# Patient Record
Sex: Male | Born: 1940 | Race: White | Hispanic: No | Marital: Married | Smoking: Never smoker
Health system: Southern US, Community
[De-identification: ages and names within clinical notes are randomized; demographics above are authoritative.]

## PROBLEM LIST (undated history)

## (undated) DIAGNOSIS — K219 Gastro-esophageal reflux disease without esophagitis: Secondary | ICD-10-CM

## (undated) DIAGNOSIS — D649 Anemia, unspecified: Secondary | ICD-10-CM

## (undated) DIAGNOSIS — M1990 Unspecified osteoarthritis, unspecified site: Secondary | ICD-10-CM

## (undated) DIAGNOSIS — K635 Polyp of colon: Secondary | ICD-10-CM

## (undated) DIAGNOSIS — J449 Chronic obstructive pulmonary disease, unspecified: Secondary | ICD-10-CM

## (undated) DIAGNOSIS — K449 Diaphragmatic hernia without obstruction or gangrene: Secondary | ICD-10-CM

## (undated) DIAGNOSIS — I10 Essential (primary) hypertension: Secondary | ICD-10-CM

## (undated) HISTORY — DX: Chronic obstructive pulmonary disease, unspecified: J44.9

## (undated) HISTORY — DX: Unspecified osteoarthritis, unspecified site: M19.90

## (undated) HISTORY — DX: Polyp of colon: K63.5

## (undated) HISTORY — DX: Essential (primary) hypertension: I10

## (undated) HISTORY — DX: Anemia, unspecified: D64.9

## (undated) HISTORY — DX: Gastro-esophageal reflux disease without esophagitis: K21.9

## (undated) HISTORY — DX: Diaphragmatic hernia without obstruction or gangrene: K44.9

---

## 2002-06-16 HISTORY — PX: REPLACEMENT TOTAL KNEE: SUR1224

## 2011-06-17 HISTORY — PX: LUMBAR LAMINECTOMY: SHX95

## 2015-06-17 HISTORY — PX: TOTAL KNEE ARTHROPLASTY: SHX125

## 2019-10-17 ENCOUNTER — Ambulatory Visit: Payer: Medicare Other | Attending: Family Medicine | Admitting: Physical Therapy

## 2019-10-17 ENCOUNTER — Encounter: Payer: Self-pay | Admitting: Physical Therapy

## 2019-10-17 ENCOUNTER — Other Ambulatory Visit: Payer: Self-pay

## 2019-10-17 DIAGNOSIS — R2689 Other abnormalities of gait and mobility: Secondary | ICD-10-CM | POA: Insufficient documentation

## 2019-10-17 DIAGNOSIS — I350 Nonrheumatic aortic (valve) stenosis: Secondary | ICD-10-CM | POA: Insufficient documentation

## 2019-10-17 DIAGNOSIS — J302 Other seasonal allergic rhinitis: Secondary | ICD-10-CM | POA: Insufficient documentation

## 2019-10-17 DIAGNOSIS — D649 Anemia, unspecified: Secondary | ICD-10-CM | POA: Insufficient documentation

## 2019-10-17 DIAGNOSIS — R296 Repeated falls: Secondary | ICD-10-CM | POA: Diagnosis present

## 2019-10-17 DIAGNOSIS — R600 Localized edema: Secondary | ICD-10-CM | POA: Insufficient documentation

## 2019-10-17 DIAGNOSIS — Z96653 Presence of artificial knee joint, bilateral: Secondary | ICD-10-CM | POA: Insufficient documentation

## 2019-10-17 DIAGNOSIS — N189 Chronic kidney disease, unspecified: Secondary | ICD-10-CM | POA: Insufficient documentation

## 2019-10-17 DIAGNOSIS — M6281 Muscle weakness (generalized): Secondary | ICD-10-CM | POA: Insufficient documentation

## 2019-10-17 NOTE — Therapy (Signed)
[facility] [address] [facility] [address] [facility] [address] [place], [place], [address] Phone: [phone]   Fax:  [phone]  Physical Therapy Evaluation  Patient Details  Name: Sean Hood MRN: [id number] Date of Birth: 1941/02/09 Referring Provider (PT): Tracey Harries   Encounter Date: 10/17/2019  PT End of Session - 10/17/19 1508    Visit Number  1    Date for PT Re-Evaluation  12/17/19    Authorization Type  Medicare    PT Start Time  1400    PT Stop Time  1442    PT Time Calculation (min)  42 min    Activity Tolerance  Patient tolerated treatment well    Behavior During Therapy  [facility] for tasks assessed/performed       Past Medical History:  Diagnosis Date  . Anemia   . Arthritis   . Colon polyp   . COPD (chronic obstructive pulmonary disease) (HCC)   . GERD (gastroesophageal reflux disease)   . Hiatal hernia   . Hypertension     Past Surgical History:  Procedure Laterality Date  . LUMBAR LAMINECTOMY  2013  . REPLACEMENT TOTAL KNEE Right 2004  . TOTAL KNEE ARTHROPLASTY Left 2017    There were no vitals filed for this visit.   Subjective Assessment - 10/17/19 1403    Subjective  Pt reports that he has been falling a lot increasing over the past year. Pt states he has fallen 15-20 times in the past month; reports no injuries with falls. Pt has had bilateral TKA. Pt states that sometimes knees "don't bend" the way they should. Pt reports that he has dizziness that sometimes causes him to feel off balance.    Pertinent History  bilateral TKA, HTN    Limitations  Lifting;Standing;Walking;House hold activities    Patient Stated Goals  fall less    Currently in Pain?  No/denies    Multiple Pain Sites  No         [facility] PT Assessment - 10/17/19 0001      Assessment   Medical Diagnosis  Frequent Falls    Referring Provider (PT)  Tracey Harries    Prior Therapy  PT for TKA      Precautions   Precautions  Fall      Restrictions   Weight Bearing Restrictions  No      Balance Screen   Has the patient fallen in the past 6 months  Yes    How many times?  30    Has the patient had a decrease in activity level because of a fear of falling?   No    Is the patient reluctant to leave their home because of a fear of falling?   No      Home Environment   Living Environment  Private residence    Living Arrangements  Spouse/significant other    Type of Home  House    Home Access  Stairs to enter    Entrance Stairs-Number of Steps  3    Entrance Stairs-Rails  Right    Additional Comments  Reports difficulty going up stairs into house      Prior Function   Level of Independence  Independent    Vocation  Retired    Leisure  yardwork, woodworking in shop (unable to do right now)      Presenter, broadcasting [employer]      Functional Tests   Functional tests  Other      Other:   Other/ Comments  occulomotor exam WFL (did not provoke sx of dizziness)      Posture/Postural Control   Posture/Postural Control  Postural limitations    Postural Limitations  Rounded Shoulders;Forward head;Increased thoracic kyphosis;Flexed trunk      ROM / Strength   AROM / PROM / Strength  Strength      Strength   Overall Strength  Deficits    Overall Strength Comments  LE knee flex/ext ankle DF 5/5, hip flex/abd 3+/5, hip ext 3-/5      Flexibility   Soft Tissue Assessment /Muscle Length  yes    Hamstrings  limited and painful B    Quadriceps  limited B    Piriformis  limited B      Palpation   Palpation comment  tender to palpation B ITB      Transfers   Five time sit to stand comments   unable to complete 5 reps successfully    Comments  needed table lifted to complete 5 sit to stand; unable to complete one sit to stand without use of UE      Ambulation/Gait   Gait Comments  walks with widened BOS      Balance   Balance Assessed  Yes      Static Standing Balance   Static Standing - Comment/# of Minutes  Rhomberg:  WFL, Rhomberg eyes closed: unable <3 sec                Objective measurements completed on examination: See above findings.      [facility] Adult PT Treatment/Exercise - 10/17/19 0001      Exercises   Exercises  Lumbar;Knee/Hip      Lumbar Exercises: Stretches   Active Hamstring Stretch  Right;Left;1 rep;30 seconds    Active Hamstring Stretch Limitations  seated      Lumbar Exercises: Seated   Sit to Stand  5 reps    Sit to Stand Limitations  needed UE support; table had to be raised after 3 reps. Fatigues very quickly with sit to stand      Lumbar Exercises: Supine   Bridge  10 reps;3 seconds      Lumbar Exercises: Sidelying   Hip Abduction  Both;10 reps    Hip Abduction Weights (lbs)  body weight             PT Education - 10/17/19 1507    Education Details  Pt educated on condition, HEP, and rehab process    Person(s) Educated  Patient    Methods  Explanation;Demonstration;Handout    Comprehension  Returned demonstration;Verbalized understanding       PT Short Term Goals - 10/17/19 1515      PT SHORT TERM GOAL #1   Title  Pt will be independent with HEP    Time  2    Period  Weeks    Status  New    Target Date  10/31/19        PT Long Term Goals - 10/17/19 1515      PT LONG TERM GOAL #1   Title  Pt will demonstrate ability to stand from chair with use of UE x5 in <15 seconds    Baseline  unable to complete    Time  8    Period  Weeks    Status  New    Target Date  12/12/19      PT LONG TERM GOAL #2  Title  Pt will improve B hip flex/abd/ext strength to 4/5    Baseline  3/5    Time  8    Period  Weeks    Target Date  12/12/19      PT LONG TERM GOAL #3   Title  Pt will report no falls at home to reduce risk of injury    Time  8    Period  Weeks    Status  New    Target Date  12/12/19      PT LONG TERM GOAL #4   Title  Pt will demonstrate ability to ascend/descend 1 set of stairs with 1 HR    Time  8    Period  Weeks    Status   New    Target Date  12/12/19             Plan - 10/17/19 1510    Clinical Impression Statement  Pt presents to clinic with reports of frequent falls and no injuries to date. Pt reports that he has trouble getting knees to work and frequently trips over feet. Pt demonstrates decreased LE strength especially in hip ext/abd/flex, LE flexibility deficits, functional strength deficits, and balance deficits. Pt would benefit from skilled PT to address the above impairments.    Personal Factors and Comorbidities  Age;Comorbidity 1    Comorbidities  HTN    Examination-Activity Limitations  Lift;Stand;Stairs;Squat;Transfers    Examination-Participation Restrictions  Community Activity;Yard Work    Stability/Clinical Decision Making  Stable/Uncomplicated    Designer, jewellery  Low    Rehab Potential  Good    PT Frequency  2x / week    PT Duration  8 weeks    PT Treatment/Interventions  ADLs/Self Care Home Management;Electrical Stimulation;Cryotherapy;Gait training;Stair training;Functional mobility training;Therapeutic activities;Therapeutic exercise;Balance training;Neuromuscular re-education;Manual techniques;Patient/family education;Passive range of motion    PT Next Visit Plan  Further assess balance, initiate LE strength/flexibility training, balance ex's, functional activities    PT Home Exercise Plan  supine bridge, seated hamstring stretch, sidelying hip abduction, sit to stand with UE support    Consulted and Agree with Plan of Care  Patient       Patient will benefit from skilled therapeutic intervention in order to improve the following deficits and impairments:  Abnormal gait, Decreased range of motion, Difficulty walking, Decreased endurance, Decreased safety awareness, Decreased activity tolerance, Decreased balance, Hypomobility, Impaired flexibility, Improper body mechanics, Decreased mobility, Decreased strength, Increased edema  Visit Diagnosis: Muscle weakness  (generalized)  Frequent falls  Balance problem     Problem List Patient Active Problem List   Diagnosis Date Noted  . Frequent falls 10/17/2019  . Seasonal allergies 10/17/2019  . Nonrheumatic aortic (valve) stenosis 10/17/2019  . Chronic kidney disease (CKD) 10/17/2019  . Anemia 10/17/2019  . Lower extremity edema 10/17/2019  . Total knee replacement status, bilateral 10/17/2019   Amador Cunas, PT, DPT Donald Prose Scarlet Abad 10/17/2019, 3:19 PM  [place], [place], [address] Phone: [phone]   Fax:  [phone]  Name: Mack Alvidrez MRN: [id number] Date of Birth: July 27, 1940

## 2019-10-26 ENCOUNTER — Ambulatory Visit: Payer: Medicare Other | Admitting: Physical Therapy

## 2019-10-26 ENCOUNTER — Encounter: Payer: Self-pay | Admitting: Physical Therapy

## 2019-10-26 ENCOUNTER — Other Ambulatory Visit: Payer: Self-pay

## 2019-10-26 DIAGNOSIS — R296 Repeated falls: Secondary | ICD-10-CM

## 2019-10-26 DIAGNOSIS — M6281 Muscle weakness (generalized): Secondary | ICD-10-CM

## 2019-10-26 DIAGNOSIS — R2689 Other abnormalities of gait and mobility: Secondary | ICD-10-CM

## 2019-10-26 NOTE — Therapy (Signed)
[facility] [address] [place], [place], [address] Phone: [phone]   Fax:  [phone]  Physical Therapy Treatment  Patient Details  Name: Sean Hood MRN: [id number] Date of Birth: 01/14/1941 Referring Provider (PT): Tracey Harries   Encounter Date: 10/26/2019  PT End of Session - 10/26/19 1507    Visit Number  2    Date for PT Re-Evaluation  12/17/19    Authorization Type  Medicare    PT Start Time  1430    PT Stop Time  1510    PT Time Calculation (min)  40 min    Activity Tolerance  Patient tolerated treatment well    Behavior During Therapy  [facility] for tasks assessed/performed       Past Medical History:  Diagnosis Date  . Anemia   . Arthritis   . Colon polyp   . COPD (chronic obstructive pulmonary disease) (HCC)   . GERD (gastroesophageal reflux disease)   . Hiatal hernia   . Hypertension     Past Surgical History:  Procedure Laterality Date  . LUMBAR LAMINECTOMY  2013  . REPLACEMENT TOTAL KNEE Right 2004  . TOTAL KNEE ARTHROPLASTY Left 2017    There were no vitals filed for this visit.  Subjective Assessment - 10/26/19 1431    Subjective  "I am doing good"    Pertinent History  bilateral TKA, HTN    Currently in Pain?  No/denies                        [facility] Adult PT Treatment/Exercise - 10/26/19 0001      High Level Balance   High Level Balance Activities  Side stepping;Backward walking    High Level Balance Comments  sidestep over foam roll 2x5       Lumbar Exercises: Aerobic   Nustep  L4 x 5 min       Knee/Hip Exercises: Machines for Strengthening   Cybex Knee Extension  10lb 2x15    Cybex Knee Flexion  25lb 2x15      Knee/Hip Exercises: Standing   Forward Step Up  Both;1 set;10 reps;Hand Hold: 1;Step Height: 6"    Other Standing Knee Exercises  Alt 4in box taps 2x10 CGA       Knee/Hip Exercises: Seated   Sit to Sand  2 sets;5 reps;without UE support   from  UBE seat               PT Short Term Goals - 10/17/19 1515      PT SHORT TERM GOAL #1   Title  Pt will be independent with HEP    Time  2    Period  Weeks    Status  New    Target Date  10/31/19        PT Long Term Goals - 10/17/19 1515      PT LONG TERM GOAL #1   Title  Pt will demonstrate ability to stand from chair with use of UE x5 in <15 seconds    Baseline  unable to complete    Time  8    Period  Weeks    Status  New    Target Date  12/12/19      PT LONG TERM GOAL #2   Title  Pt will improve B hip flex/abd/ext strength to 4/5    Baseline  3/5    Time  8  Period  Weeks    Target Date  12/12/19      PT LONG TERM GOAL #3   Title  Pt will report no falls at home to reduce risk of injury    Time  8    Period  Weeks    Status  New    Target Date  12/12/19      PT LONG TERM GOAL #4   Title  Pt will demonstrate ability to ascend/descend 1 set of stairs with 1 HR    Time  8    Period  Weeks    Status  New    Target Date  12/12/19            Plan - 10/26/19 1508    Clinical Impression Statement  Pt tolerated an initial progression to TE fair. He reports some issues with his L knee. He stated that's it is always sore (TKA 2018). Today's he was abe to complete sit to stands from elevated UBE seat. Increase fatigue noted throughout requiring multiple rest breaks. CGA needed for most balance activities. Unable to complete all 10 reps with LLE doing step ups.    Personal Factors and Comorbidities  Age;Comorbidity 1    Comorbidities  HTN    Examination-Activity Limitations  Lift;Stand;Stairs;Squat;Transfers    Examination-Participation Restrictions  Community Activity;Yard Work;School    Stability/Clinical Decision Making  Stable/Uncomplicated    Rehab Potential  Good    PT Frequency  2x / week    PT Duration  8 weeks    PT Treatment/Interventions  ADLs/Self Care Home Management;Electrical Stimulation;Cryotherapy;Gait training;Stair training;Functional  mobility training;Therapeutic activities;Therapeutic exercise;Balance training;Neuromuscular re-education;Manual techniques;Patient/family education;Passive range of motion    PT Next Visit Plan  Further assess balance, initiate LE strength/flexibility training, balance ex's, functional activities       Patient will benefit from skilled therapeutic intervention in order to improve the following deficits and impairments:  Abnormal gait, Decreased range of motion, Difficulty walking, Decreased endurance, Decreased safety awareness, Decreased activity tolerance, Decreased balance, Hypomobility, Impaired flexibility, Improper body mechanics, Decreased mobility, Decreased strength, Increased edema  Visit Diagnosis: Muscle weakness (generalized)  Frequent falls  Balance problem     Problem List Patient Active Problem List   Diagnosis Date Noted  . Frequent falls 10/17/2019  . Seasonal allergies 10/17/2019  . Nonrheumatic aortic (valve) stenosis 10/17/2019  . Chronic kidney disease (CKD) 10/17/2019  . Anemia 10/17/2019  . Lower extremity edema 10/17/2019  . Total knee replacement status, bilateral 10/17/2019    Scot Jun, PTA 10/26/2019, 3:12 PM  [place], [place], [address] Phone: [phone]   Fax:  [phone]  Name: Sean Hood MRN: [id number] Date of Birth: May 10, 1941

## 2019-11-01 ENCOUNTER — Other Ambulatory Visit: Payer: Self-pay

## 2019-11-01 ENCOUNTER — Encounter: Payer: Self-pay | Admitting: Physical Therapy

## 2019-11-01 ENCOUNTER — Ambulatory Visit: Payer: Medicare Other | Admitting: Physical Therapy

## 2019-11-01 DIAGNOSIS — R296 Repeated falls: Secondary | ICD-10-CM

## 2019-11-01 DIAGNOSIS — M6281 Muscle weakness (generalized): Secondary | ICD-10-CM

## 2019-11-01 DIAGNOSIS — R2689 Other abnormalities of gait and mobility: Secondary | ICD-10-CM

## 2019-11-01 NOTE — Therapy (Signed)
[place], [place], [address] Phone: [phone]   Fax:  [phone]  Physical Therapy Treatment  Patient Details  Name: Sean Hood MRN: [id number] Date of Birth: [birth year] Referring Provider (PT): Bernerd Limbo   Encounter Date: 11/01/2019  PT End of Session - 11/01/19 1445    Visit Number  3    Date for PT Re-Evaluation  12/17/19    PT Start Time  1400    PT Stop Time  1445    PT Time Calculation (min)  45 min    Activity Tolerance  Patient tolerated treatment well    Behavior During Therapy  [facility] for tasks assessed/performed       Past Medical History:  Diagnosis Date  . Anemia   . Arthritis   . Colon polyp   . COPD (chronic obstructive pulmonary disease) ([place])   . GERD (gastroesophageal reflux disease)   . Hiatal hernia   . Hypertension     Past Surgical History:  Procedure Laterality Date  . LUMBAR LAMINECTOMY  2013  . REPLACEMENT TOTAL KNEE Right 2004  . TOTAL KNEE ARTHROPLASTY Left 2017    There were no vitals filed for this visit.  Subjective Assessment - 11/01/19 1407    Subjective  Pt states he is moving a lot better around the house and feels more steady on his feet    Currently in Pain?  No/denies                        [facility] Adult PT Treatment/Exercise - 11/01/19 0001      Lumbar Exercises: Stretches   Gastroc Stretch  Right;Left;30 seconds      Lumbar Exercises: Aerobic   Nustep  L5 x 6 min      Lumbar Exercises: Standing   Heel Raises  15 reps    Heel Raises Limitations  needed UE to stabilize    Other Standing Lumbar Exercises  mini squat with exercise ball x10 5 sec hold      Knee/Hip Exercises: Machines for Strengthening   Cybex Knee Extension  15# 2x15, 5# LLE 1x15    Cybex Knee Flexion  35# 2x15    Cybex Leg Press  40# 1x10 BLE, 20# 1x10 LLE/RLE    Other Machine  resisted gait 30# x3 each direction      Knee/Hip Exercises:  Standing   Forward Step Up  Both;2 sets;5 reps;Step Height: 6";Hand Hold: 1    Step Down  Both;2 sets;5 reps;Hand Hold: 1;Step Height: 4"               PT Short Term Goals - 10/17/19 1515      PT SHORT TERM GOAL #1   Title  Pt will be independent with HEP    Time  2    Period  Weeks    Status  New    Target Date  10/31/19        PT Long Term Goals - 10/17/19 1515      PT LONG TERM GOAL #1   Title  Pt will demonstrate ability to stand from chair with use of UE x5 in <15 seconds    Baseline  unable to complete    Time  8    Period  Weeks    Status  New    Target Date  12/12/19      PT LONG TERM GOAL #2  Title  Pt will improve B hip flex/abd/ext strength to 4/5    Baseline  3/5    Time  8    Period  Weeks    Target Date  12/12/19      PT LONG TERM GOAL #3   Title  Pt will report no falls at home to reduce risk of injury    Time  8    Period  Weeks    Status  New    Target Date  12/12/19      PT LONG TERM GOAL #4   Title  Pt will demonstrate ability to ascend/descend 1 set of stairs with 1 HR    Time  8    Period  Weeks    Status  New    Target Date  12/12/19            Plan - 11/01/19 1446    Clinical Impression Statement  Pt tolerated progression of TE well; reports of L knee pain with weightbearing ex's. CGA required for resisted gait d/t occasional LOB esp with sidestepping. Some instability noted with step downs on BLE. Continue to progress next rx.    PT Treatment/Interventions  ADLs/Self Care Home Management;Electrical Stimulation;Cryotherapy;Gait training;Stair training;Functional mobility training;Therapeutic activities;Therapeutic exercise;Balance training;Neuromuscular re-education;Manual techniques;Patient/family education;Passive range of motion    PT Next Visit Plan  Progress LE strength/flexibility training, balance ex's, functional activities    Consulted and Agree with Plan of Care  Patient       Patient will benefit from skilled  therapeutic intervention in order to improve the following deficits and impairments:  Abnormal gait, Decreased range of motion, Difficulty walking, Decreased endurance, Decreased safety awareness, Decreased activity tolerance, Decreased balance, Hypomobility, Impaired flexibility, Improper body mechanics, Decreased mobility, Decreased strength, Increased edema  Visit Diagnosis: Muscle weakness (generalized)  Frequent falls  Balance problem     Problem List Patient Active Problem List   Diagnosis Date Noted  . Frequent falls 10/17/2019  . Seasonal allergies 10/17/2019  . Nonrheumatic aortic (valve) stenosis 10/17/2019  . Chronic kidney disease (CKD) 10/17/2019  . Anemia 10/17/2019  . Lower extremity edema 10/17/2019  . Total knee replacement status, bilateral 10/17/2019   Lysle Rubens, PT, DPT Maryanna Shape Juwon Scripter 11/01/2019, 2:48 PM  [facility] [address] [place], [place], [address] Phone: [phone]   Fax:  [phone]  Name: Sean Hood MRN: [id number] Date of Birth: Oct 24, 1940

## 2019-11-03 ENCOUNTER — Ambulatory Visit: Payer: Medicare Other | Admitting: Physical Therapy

## 2019-11-08 ENCOUNTER — Other Ambulatory Visit: Payer: Self-pay

## 2019-11-08 ENCOUNTER — Encounter: Payer: Self-pay | Admitting: Physical Therapy

## 2019-11-08 ENCOUNTER — Ambulatory Visit: Payer: Medicare Other | Admitting: Physical Therapy

## 2019-11-08 DIAGNOSIS — R2689 Other abnormalities of gait and mobility: Secondary | ICD-10-CM

## 2019-11-08 DIAGNOSIS — M6281 Muscle weakness (generalized): Secondary | ICD-10-CM | POA: Diagnosis not present

## 2019-11-08 DIAGNOSIS — R296 Repeated falls: Secondary | ICD-10-CM

## 2019-11-08 NOTE — Therapy (Signed)
[place], [place], [address] Phone: [phone]   Fax:  [phone]  Physical Therapy Treatment  Patient Details  Name: Sean Hood MRN: [id number] Date of Birth: Feb 17, 1941 Referring Provider (PT): Bernerd Limbo   Encounter Date: 11/08/2019  PT End of Session - 11/08/19 1504    Visit Number  4    Date for PT Re-Evaluation  12/17/19    Authorization Type  Medicare    PT Start Time  1425    PT Stop Time  1505    PT Time Calculation (min)  40 min    Activity Tolerance  Patient tolerated treatment well    Behavior During Therapy  [facility] for tasks assessed/performed       Past Medical History:  Diagnosis Date  . Anemia   . Arthritis   . Colon polyp   . COPD (chronic obstructive pulmonary disease) ([place])   . GERD (gastroesophageal reflux disease)   . Hiatal hernia   . Hypertension     Past Surgical History:  Procedure Laterality Date  . LUMBAR LAMINECTOMY  2013  . REPLACEMENT TOTAL KNEE Right 2004  . TOTAL KNEE ARTHROPLASTY Left 2017    There were no vitals filed for this visit.  Subjective Assessment - 11/08/19 1427    Subjective  "Feeling great" No falls      0/10 pain                  [facility] Adult PT Treatment/Exercise - 11/08/19 0001      Lumbar Exercises: Aerobic   Nustep  L5 x 6 min      Lumbar Exercises: Standing   Shoulder Extension  20 reps;Left;Strengthening    Shoulder Extension Limitations  10      Knee/Hip Exercises: Machines for Strengthening   Cybex Knee Extension  15# 2x15, 5# LLE 1x10    Cybex Knee Flexion  35# 2x15    Cybex Leg Press  40# 1x10 BLE, 20# 1x10 LLE/RLE    Other Machine  resisted gait 30# x3 each direction      Knee/Hip Exercises: Standing   Heel Raises  Both;2 sets;15 reps;2 seconds    Forward Step Up  Both;2 sets;5 reps;Step Height: 6";Hand Hold: 1               PT Short Term Goals - 10/17/19 1515      PT SHORT  TERM GOAL #1   Title  Pt will be independent with HEP    Time  2    Period  Weeks    Status  New    Target Date  10/31/19        PT Long Term Goals - 11/08/19 1439      PT LONG TERM GOAL #3   Title  Pt will report no falls at home to reduce risk of injury    Status  Achieved            Plan - 11/08/19 1505    Clinical Impression Statement  Pt is doing well and is progressing towards goals. He reports that he feel good about his current functional status. increase fatigue noted with the interventions. LLE fatigue quick when isolated against resistance. Cues not to drag LLE with resisted side step pushing to his R. One bout of LOB when stepping back doing step ups but able to self correct.    Personal Factors and Comorbidities  Age;Comorbidity 1  Examination-Activity Limitations  Lift;Stand;Stairs;Squat;Transfers    Examination-Participation Restrictions  Community Activity;Yard Work;School    Stability/Clinical Decision Making  Stable/Uncomplicated    Rehab Potential  Good    PT Frequency  2x / week    PT Duration  8 weeks    PT Treatment/Interventions  ADLs/Self Care Home Management;Electrical Stimulation;Cryotherapy;Gait training;Stair training;Functional mobility training;Therapeutic activities;Therapeutic exercise;Balance training;Neuromuscular re-education;Manual techniques;Patient/family education;Passive range of motion    PT Next Visit Plan  Progress LE strength/flexibility training, balance ex's, functional activities       Patient will benefit from skilled therapeutic intervention in order to improve the following deficits and impairments:  Abnormal gait, Decreased range of motion, Difficulty walking, Decreased endurance, Decreased safety awareness, Decreased activity tolerance, Decreased balance, Hypomobility, Impaired flexibility, Improper body mechanics, Decreased mobility, Decreased strength, Increased edema  Visit Diagnosis: Frequent falls  Balance  problem  Muscle weakness (generalized)     Problem List Patient Active Problem List   Diagnosis Date Noted  . Frequent falls 10/17/2019  . Seasonal allergies 10/17/2019  . Nonrheumatic aortic (valve) stenosis 10/17/2019  . Chronic kidney disease (CKD) 10/17/2019  . Anemia 10/17/2019  . Lower extremity edema 10/17/2019  . Total knee replacement status, bilateral 10/17/2019    Grayce Sessions, PTA 11/08/2019, 3:21 PM  [facility] [address] [facility] [address] [facility] [address] [place], [place], [address] Phone: [phone]   Fax:  [phone]  Name: Sean Hood MRN: [id number] Date of Birth: 05/28/1941

## 2019-11-10 ENCOUNTER — Ambulatory Visit: Payer: Medicare Other | Admitting: Physical Therapy

## 2019-11-10 ENCOUNTER — Other Ambulatory Visit: Payer: Self-pay

## 2019-11-10 ENCOUNTER — Encounter: Payer: Self-pay | Admitting: Physical Therapy

## 2019-11-10 DIAGNOSIS — M6281 Muscle weakness (generalized): Secondary | ICD-10-CM | POA: Diagnosis not present

## 2019-11-10 DIAGNOSIS — R296 Repeated falls: Secondary | ICD-10-CM

## 2019-11-10 DIAGNOSIS — R2689 Other abnormalities of gait and mobility: Secondary | ICD-10-CM

## 2019-11-10 NOTE — Therapy (Signed)
[place], [place], [address] Phone: [phone]   Fax:  [phone]  Physical Therapy Treatment  Patient Details  Name: Sean Hood MRN: [id number] Date of Birth: Apr 16, 1941 Referring Provider (PT): Bernerd Limbo   Encounter Date: 11/10/2019  PT End of Session - 11/10/19 1453    Visit Number  5    Date for PT Re-Evaluation  12/17/19    Authorization Type  Medicare    PT Start Time  1430    PT Stop Time  1455    PT Time Calculation (min)  25 min    Activity Tolerance  Patient tolerated treatment well    Behavior During Therapy  [facility] for tasks assessed/performed       Past Medical History:  Diagnosis Date  . Anemia   . Arthritis   . Colon polyp   . COPD (chronic obstructive pulmonary disease) ([place])   . GERD (gastroesophageal reflux disease)   . Hiatal hernia   . Hypertension     Past Surgical History:  Procedure Laterality Date  . LUMBAR LAMINECTOMY  2013  . REPLACEMENT TOTAL KNEE Right 2004  . TOTAL KNEE ARTHROPLASTY Left 2017    There were no vitals filed for this visit.  Subjective Assessment - 11/10/19 1435    Subjective  No falls, feeling great a little L shoulder pain    Currently in Pain?  Yes    Pain Score  4     Pain Location  Shoulder    Pain Orientation  Left         [facility] PT Assessment - 11/10/19 0001      Strength   Overall Strength Comments  hip flex/abd 4/5, hip ext 4/5                    [facility] Adult PT Treatment/Exercise - 11/10/19 0001      Ambulation/Gait   Stairs  Yes    Stairs Assistance  6: Modified independent (Device/Increase time)    Stair Management Technique  One rail Right;Alternating pattern    Number of Stairs  12    Height of Stairs  6      Lumbar Exercises: Aerobic   Nustep  L5 x 6 min      Knee/Hip Exercises: Machines for Strengthening   Cybex Knee Extension  15# 2x15    Cybex Knee Flexion  35# 2x15    Cybex Leg  Press  40# 2x10 BLE               PT Short Term Goals - 10/17/19 1515      PT SHORT TERM GOAL #1   Title  Pt will be independent with HEP    Time  2    Period  Weeks    Status  New    Target Date  10/31/19        PT Long Term Goals - 11/10/19 1443      PT LONG TERM GOAL #2   Title  Pt will improve B hip flex/abd/ext strength to 4/5    Status  Achieved            Plan - 11/10/19 1454    Clinical Impression Statement  Pt has progressed meeting all goals. He reports feeling safe at home and reports no functional limitations. He is pleased with his current functional status.    Personal Factors and Comorbidities  Age;Comorbidity 1  Examination-Activity Limitations  Lift;Stand;Stairs;Squat;Transfers    Examination-Participation Restrictions  Community Activity;Yard Work;School    Stability/Clinical Decision Making  Stable/Uncomplicated    Rehab Potential  Good    PT Frequency  2x / week    PT Duration  8 weeks    PT Treatment/Interventions  ADLs/Self Care Home Management;Electrical Stimulation;Cryotherapy;Gait training;Stair training;Functional mobility training;Therapeutic activities;Therapeutic exercise;Balance training;Neuromuscular re-education;Manual techniques;Patient/family education;Passive range of motion    PT Next Visit Plan  D/C PT       Patient will benefit from skilled therapeutic intervention in order to improve the following deficits and impairments:  Abnormal gait, Decreased range of motion, Difficulty walking, Decreased endurance, Decreased safety awareness, Decreased activity tolerance, Decreased balance, Hypomobility, Impaired flexibility, Improper body mechanics, Decreased mobility, Decreased strength, Increased edema  Visit Diagnosis: Muscle weakness (generalized)  Balance problem  Frequent falls     Problem List Patient Active Problem List   Diagnosis Date Noted  . Frequent falls 10/17/2019  . Seasonal allergies 10/17/2019  .  Nonrheumatic aortic (valve) stenosis 10/17/2019  . Chronic kidney disease (CKD) 10/17/2019  . Anemia 10/17/2019  . Lower extremity edema 10/17/2019  . Total knee replacement status, bilateral 10/17/2019   PHYSICAL THERAPY DISCHARGE SUMMARY  Visits from Start of Care: 5  Plan: Patient agrees to discharge.  Patient goals were met. Patient is being discharged due to meeting the stated rehab goals.  ?????       Scot Jun, PTA 11/10/2019, 2:55 PM  [place], [place], [address] Phone: [phone]   Fax:  [phone]  Name: Avenir Lozinski MRN: [id number] Date of Birth: Dec 01, 1940

## 2019-11-21 ENCOUNTER — Encounter: Payer: Medicare Other | Admitting: Physical Therapy

## 2019-11-24 ENCOUNTER — Encounter: Payer: Medicare Other | Admitting: Physical Therapy

## 2020-04-27 ENCOUNTER — Emergency Department (HOSPITAL_BASED_OUTPATIENT_CLINIC_OR_DEPARTMENT_OTHER): Payer: Medicare Other

## 2020-04-27 ENCOUNTER — Encounter (HOSPITAL_BASED_OUTPATIENT_CLINIC_OR_DEPARTMENT_OTHER): Payer: Self-pay | Admitting: *Deleted

## 2020-04-27 ENCOUNTER — Other Ambulatory Visit: Payer: Self-pay

## 2020-04-27 ENCOUNTER — Emergency Department (HOSPITAL_BASED_OUTPATIENT_CLINIC_OR_DEPARTMENT_OTHER)
Admission: EM | Admit: 2020-04-27 | Discharge: 2020-04-27 | Disposition: A | Discharge status: Home / Self Care | Payer: Medicare Other | Attending: Emergency Medicine | Admitting: Emergency Medicine

## 2020-04-27 DIAGNOSIS — I129 Hypertensive chronic kidney disease with stage 1 through stage 4 chronic kidney disease, or unspecified chronic kidney disease: Secondary | ICD-10-CM | POA: Diagnosis not present

## 2020-04-27 DIAGNOSIS — Z86010 Personal history of colonic polyps: Secondary | ICD-10-CM | POA: Insufficient documentation

## 2020-04-27 DIAGNOSIS — Y9301 Activity, walking, marching and hiking: Secondary | ICD-10-CM | POA: Insufficient documentation

## 2020-04-27 DIAGNOSIS — S0990XA Unspecified injury of head, initial encounter: Secondary | ICD-10-CM | POA: Diagnosis present

## 2020-04-27 DIAGNOSIS — S41112A Laceration without foreign body of left upper arm, initial encounter: Secondary | ICD-10-CM | POA: Diagnosis not present

## 2020-04-27 DIAGNOSIS — Z96653 Presence of artificial knee joint, bilateral: Secondary | ICD-10-CM | POA: Diagnosis not present

## 2020-04-27 DIAGNOSIS — S20412A Abrasion of left back wall of thorax, initial encounter: Secondary | ICD-10-CM | POA: Insufficient documentation

## 2020-04-27 DIAGNOSIS — Y9289 Other specified places as the place of occurrence of the external cause: Secondary | ICD-10-CM | POA: Insufficient documentation

## 2020-04-27 DIAGNOSIS — W228XXA Striking against or struck by other objects, initial encounter: Secondary | ICD-10-CM | POA: Insufficient documentation

## 2020-04-27 DIAGNOSIS — W108XXA Fall (on) (from) other stairs and steps, initial encounter: Secondary | ICD-10-CM | POA: Insufficient documentation

## 2020-04-27 DIAGNOSIS — Z79899 Other long term (current) drug therapy: Secondary | ICD-10-CM | POA: Insufficient documentation

## 2020-04-27 DIAGNOSIS — N189 Chronic kidney disease, unspecified: Secondary | ICD-10-CM | POA: Diagnosis not present

## 2020-04-27 DIAGNOSIS — J449 Chronic obstructive pulmonary disease, unspecified: Secondary | ICD-10-CM | POA: Insufficient documentation

## 2020-04-27 DIAGNOSIS — S0003XA Contusion of scalp, initial encounter: Secondary | ICD-10-CM | POA: Insufficient documentation

## 2020-04-27 NOTE — ED Provider Notes (Signed)
[facility] EMERGENCY DEPARTMENT Provider Note   CSN: [id number] Arrival date & time: 04/27/20  1642     History Chief Complaint  Patient presents with  . Fall    Sean Hood is a 79 y.o. male.  He is brought in by his wife for evaluation of injuries after a fall.  He says he was walking up the steps onto the porch when he fell backwards landing on the stone pavers.  Struck his head.  Scalp hematoma.  No loss of consciousness.  Also skin tears to left upper arm left superior shoulder: Also abrasion left posterior chest.  No numbness or weakness.  No chest pain abdominal pain neck pain back pain.  Last tetanus was less than 5 years ago.  The history is provided by the patient and the spouse.  Fall This is a new problem. The current episode started 1 to 2 hours ago. The problem has not changed since onset.Associated symptoms include headaches. Pertinent negatives include no chest pain, no abdominal pain and no shortness of breath. Nothing aggravates the symptoms. Nothing relieves the symptoms. He has tried nothing for the symptoms. The treatment provided no relief.       Past Medical History:  Diagnosis Date  . Anemia   . Arthritis   . Colon polyp   . COPD (chronic obstructive pulmonary disease) ([facility])   . GERD (gastroesophageal reflux disease)   . Hiatal hernia   . Hypertension     Patient Active Problem List   Diagnosis Date Noted  . Frequent falls 10/17/2019  . Seasonal allergies 10/17/2019  . Nonrheumatic aortic (valve) stenosis 10/17/2019  . Chronic kidney disease (CKD) 10/17/2019  . Anemia 10/17/2019  . Lower extremity edema 10/17/2019  . Total knee replacement status, bilateral 10/17/2019    Past Surgical History:  Procedure Laterality Date  . LUMBAR LAMINECTOMY  2013  . REPLACEMENT TOTAL KNEE Right 2004  . TOTAL KNEE ARTHROPLASTY Left 2017       No family history on file.  Social History   Tobacco Use  . Smoking status: Never Smoker  .  Smokeless tobacco: Never Used  Substance Use Topics  . Alcohol use: Never  . Drug use: Never    Home Medications Prior to Admission medications   Medication Sig Start Date End Date Taking? Authorizing Provider  hydrOXYzine (ATARAX/VISTARIL) 25 MG tablet Take 25 mg by mouth.   Yes [provider]  LORazepam (ATIVAN) 0.5 MG tablet Take 0.5 mg by mouth every 8 (eight) hours as needed for anxiety.   Yes [provider]  omeprazole (PRILOSEC) 40 MG capsule Take 40 mg by mouth daily.   Yes [provider]  testosterone cypionate (DEPOTESTOSTERONE CYPIONATE) 200 MG/ML injection Inject 200 mg into the muscle.   Yes [provider]    Allergies    Patient has no known allergies.  Review of Systems   Review of Systems  Constitutional: Negative for fever.  HENT: Negative for sore throat.   Eyes: Negative for visual disturbance.  Respiratory: Negative for shortness of breath.   Cardiovascular: Negative for chest pain.  Gastrointestinal: Negative for abdominal pain.  Genitourinary: Negative for dysuria.  Musculoskeletal: Negative for neck pain.  Skin: Positive for wound. Negative for rash.  Neurological: Positive for headaches.    Physical Exam Updated Vital Signs BP (!) 137/59   Pulse 80   Temp 98.4 F (36.9 C) (Oral)   Resp 18   Ht 6\' 2"  (1.88 m)  Wt 93.7 kg   SpO2 99%   BMI 26.51 kg/m   Physical Exam Vitals and nursing note reviewed.  Constitutional:      Appearance: Normal appearance. He is well-developed.  HENT:     Head: Normocephalic.     Comments: Approximately 3 cm scalp hematoma crown Eyes:     Conjunctiva/sclera: Conjunctivae normal.  Cardiovascular:     Rate and Rhythm: Normal rate and regular rhythm.     Heart sounds: No murmur heard.   Pulmonary:     Effort: Pulmonary effort is normal. No respiratory distress.     Breath sounds: Normal breath sounds.  Abdominal:     Palpations: Abdomen is soft.     Tenderness: There  is no abdominal tenderness.  Musculoskeletal:        General: Tenderness and signs of injury present. Normal range of motion.     Cervical back: Neck supple.     Comments: Full range of motion of upper extremities without any limitations.  He has a large skin tear left lateral upper arm and also superior shoulder.  Also has a linear abrasion left posterior chest wall.  Lower extremities full range of motion without any pain or limitations.  Skin:    General: Skin is warm and dry.     Capillary Refill: Capillary refill takes less than 2 seconds.  Neurological:     General: No focal deficit present.     Mental Status: He is alert.     Sensory: No sensory deficit.     Motor: No weakness.     Gait: Gait normal.     ED Results / Procedures / Treatments   Labs (all labs ordered are listed, but only abnormal results are displayed) Labs Reviewed - No data to display  EKG None  Radiology DG Chest 1 View  Result Date: 04/27/2020 CLINICAL DATA:  Pain status post fall. EXAM: CHEST  1 VIEW COMPARISON:  03/08/2020 FINDINGS: Chronic lung changes are noted bilaterally, most notably on the left. There is improved aeration throughout much of the left lung field with compared to the prior x-ray in September. There is no pneumothorax. The lungs are hyperexpanded. 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 The heart size is unremarkable. Aortic calcifications are noted. IMPRESSION: 1. No definite acute cardiopulmonary process. 2. Improving patchy nodular airspace opacities bilaterally when compared to the patient's prior chest x-ray from September. Electronically Signed   By: Katherine Mantle M.D.   On: 04/27/2020 20:02   CT Head Wo Contrast  Result Date: 04/27/2020 CLINICAL DATA:  79 year old male with fall. EXAM: CT HEAD WITHOUT CONTRAST CT CERVICAL SPINE WITHOUT CONTRAST TECHNIQUE: Multidetector CT imaging of the head and cervical spine was performed following the standard protocol  without intravenous contrast. Multiplanar CT image reconstructions of the cervical spine were also generated. COMPARISON:  Head CT dated 05/20/2017. FINDINGS: CT HEAD FINDINGS Brain: Mild age-related atrophy and chronic microvascular ischemic changes. There is no acute intracranial hemorrhage. No mass effect or midline shift. No extra-axial fluid collection. Vascular: No hyperdense vessel or unexpected calcification. Skull: Normal. Negative for fracture or focal lesion. Sinuses/Orbits: No acute finding. Other: Left posterior parietal scalp hematoma. CT CERVICAL SPINE FINDINGS Alignment: No acute subluxation. Skull base and vertebrae: No acute fracture. Osteopenia. Soft tissues and spinal canal: No prevertebral fluid or swelling. No visible canal hematoma. Disc levels:  Degenerative changes. No acute findings. Upper chest: A 15 mm right upper lobe subpleural linear density, likely scarring. This was  present on the CT of 2020. Other: Bilateral carotid bulb calcified plaques. IMPRESSION: 1. No acute intracranial hemorrhage. Mild age-related atrophy and chronic microvascular ischemic changes. 2. No acute/traumatic cervical spine pathology. Electronically Signed   By: Elgie Collard M.D.   On: 04/27/2020 20:22   CT Cervical Spine Wo Contrast  Result Date: 04/27/2020 CLINICAL DATA:  79 year old male with fall. EXAM: CT HEAD WITHOUT CONTRAST CT CERVICAL SPINE WITHOUT CONTRAST TECHNIQUE: Multidetector CT imaging of the head and cervical spine was performed following the standard protocol without intravenous contrast. Multiplanar CT image reconstructions of the cervical spine were also generated. COMPARISON:  Head CT dated 05/20/2017. FINDINGS: CT HEAD FINDINGS Brain: Mild age-related atrophy and chronic microvascular ischemic changes. There is no acute intracranial hemorrhage. No mass effect or midline shift. No extra-axial fluid collection. Vascular: No hyperdense vessel or unexpected calcification. Skull: Normal.  Negative for fracture or focal lesion. Sinuses/Orbits: No acute finding. Other: Left posterior parietal scalp hematoma. CT CERVICAL SPINE FINDINGS Alignment: No acute subluxation. Skull base and vertebrae: No acute fracture. Osteopenia. Soft tissues and spinal canal: No prevertebral fluid or swelling. No visible canal hematoma. Disc levels:  Degenerative changes. No acute findings. Upper chest: A 15 mm right upper lobe subpleural linear density, likely scarring. This was present on the CT of 2020. Other: Bilateral carotid bulb calcified plaques. IMPRESSION: 1. No acute intracranial hemorrhage. Mild age-related atrophy and chronic microvascular ischemic changes. 2. No acute/traumatic cervical spine pathology. Electronically Signed   By: Elgie Collard M.D.   On: 04/27/2020 20:22    Procedures Procedures (including critical care time)  Medications Ordered in ED Medications - No data to display  ED Course  I have reviewed the triage vital signs and the nursing notes.  Pertinent labs & imaging results that were available during my care of the patient were reviewed by me and considered in my medical decision making (see chart for details).  Clinical Course as of Apr 28 944  Caleen Essex Apr 27, 2020  1952 CT head and C-spine did not show any acute fracture. Awaiting radiology reading. Chest x-ray with no pneumothorax.   [MB]  2026 Reviewed results with patient.  His wounds have been cleaned up and dressed.  They are comfortable plan for discharge and outpatient follow-up.   [MB]    Clinical Course User Index [MB] Terrilee Files, MD   MDM Rules/Calculators/A&P                         79 year old male with injuries to head left arm and posterior chest after a fall.  Neurovascularly intact.  Differential diagnosis includes intracranial bleed, skull fracture, cervical fracture, pneumothorax.  Getting imaging of head and neck, chest.  Final Clinical Impression(s) / ED Diagnoses Final diagnoses:    Injury of head, initial encounter  Contusion of scalp, initial encounter  Skin tear of left upper arm without complication, initial encounter  Abrasion of left side of back, initial encounter    Rx / DC Orders ED Discharge Orders    None       Terrilee Files, MD 04/28/20 0945

## 2020-04-27 NOTE — ED Notes (Signed)
Skin tears noted to L shoulder and L upper arm. Abrasion noted to mid back. Wounds cleaned and dressed. Pt states he slid down 4 stairs. Pt states he hit his head. Hematoma preset upon arrival to ER room. Pt A&O in NAD, respirations even and unlabored. Skin warm and dry.

## 2020-04-27 NOTE — Discharge Instructions (Addendum)
You were seen in the emergency department for evaluation of injuries from a fall.  You had a CAT scan of your head and neck along with a chest x-ray that did not show any acute traumatic findings.  You have skin tears of your left upper arm and left shoulder and an abrasion on your back.  Please watch that these do not show signs of infection.  Tylenol as needed for pain.  Return to the emergency department for any worsening or concerning symptoms

## 2020-04-27 NOTE — ED Triage Notes (Addendum)
Slipped and fell down 4 wooden steps. He hit the top of his head. No LOC. Hematoma to his scalp. Abrasions over his body. He does not take blood thinners.

## 2021-01-04 IMAGING — CT CT CERVICAL SPINE W/O CM
3 of 4 series · 12 of 33 positions shown, 14 images · non-contrast
Comparison: Head CT dated 05/20/2017.

CLINICAL DATA: 79-year-old male with fall.

EXAM:
CT HEAD WITHOUT CONTRAST
CT CERVICAL SPINE WITHOUT CONTRAST
TECHNIQUE: Multidetector CT imaging of the head and cervical spine was
performed following the standard protocol without intravenous
contrast. Multiplanar CT image reconstructions of the cervical spine
were also generated.

[Series 5: coronals · coronal · 0.28mm/px · 3 of 65 slices shown]
[im 13/65  bone]
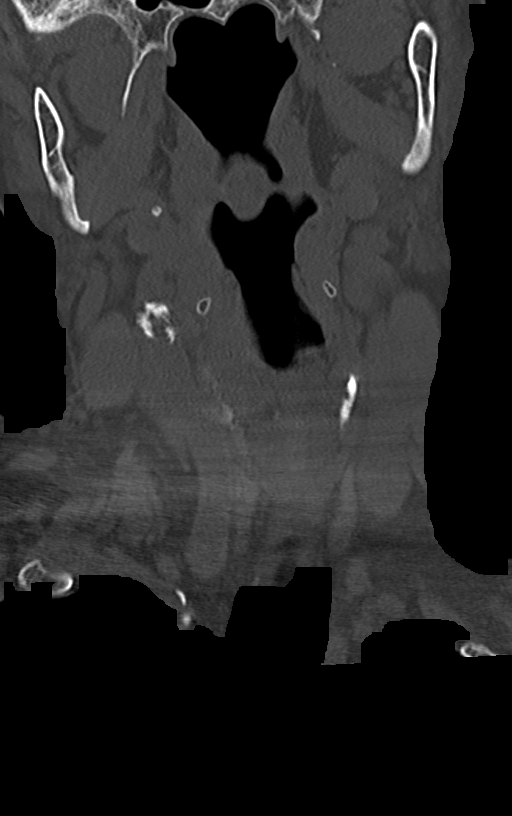
[im 26/65  bone]
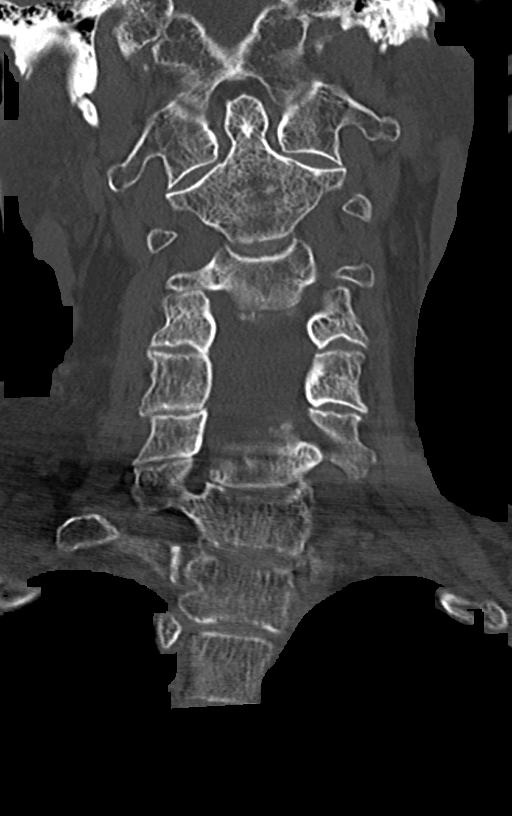
[im 39/65  bone]
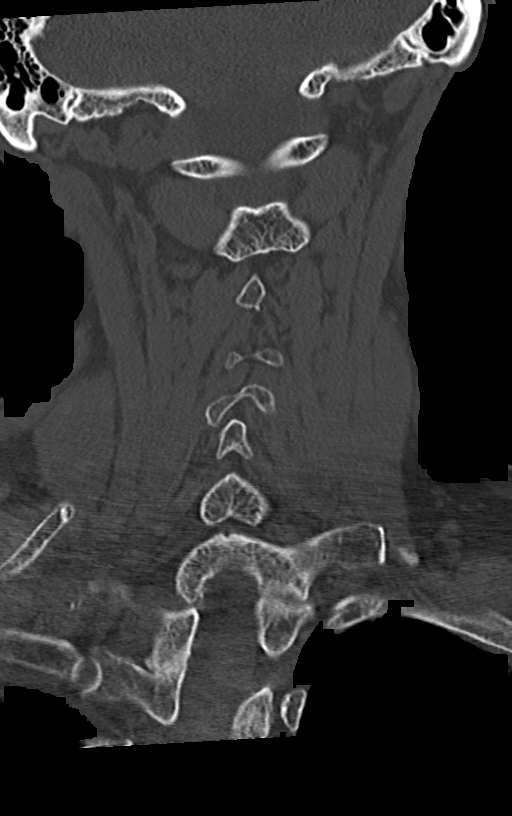

[Series 6: sagittals · sagittal · 0.32mm/px · 5 of 64 slices shown, 6 images]
[im 22/64  bone]
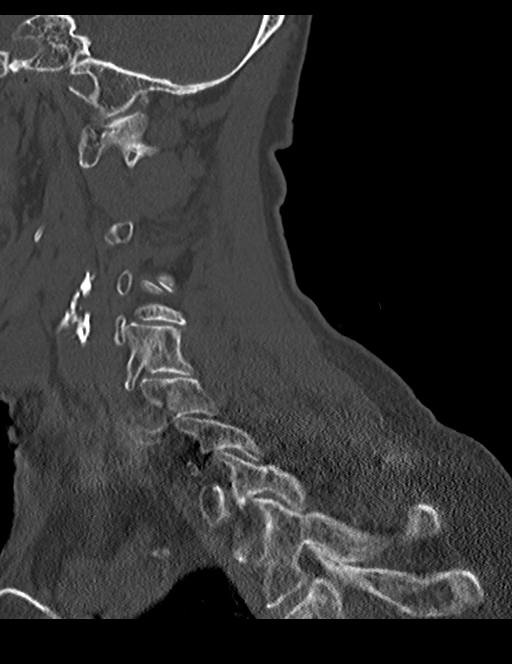
[im 27/64  bone]
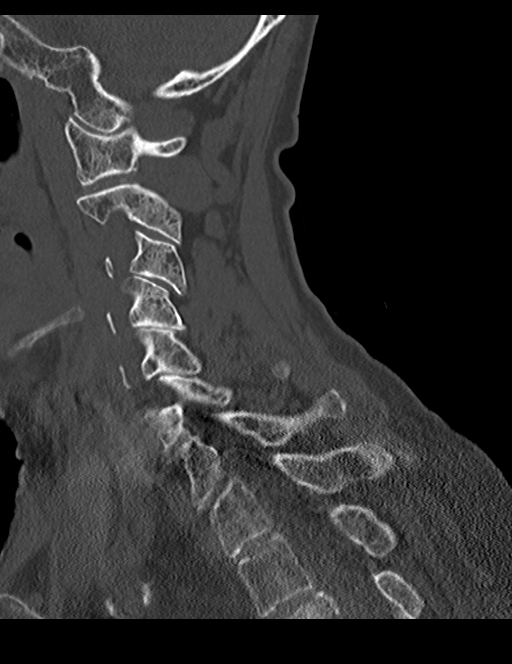
[im 32/64  soft-tissue]
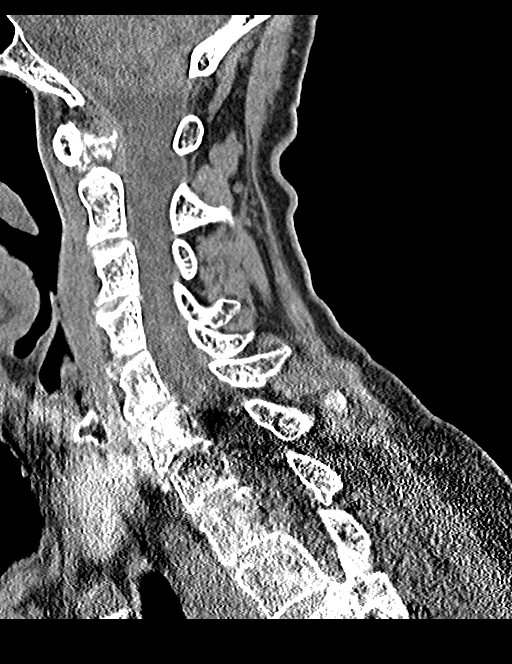
[im 32/64  bone]
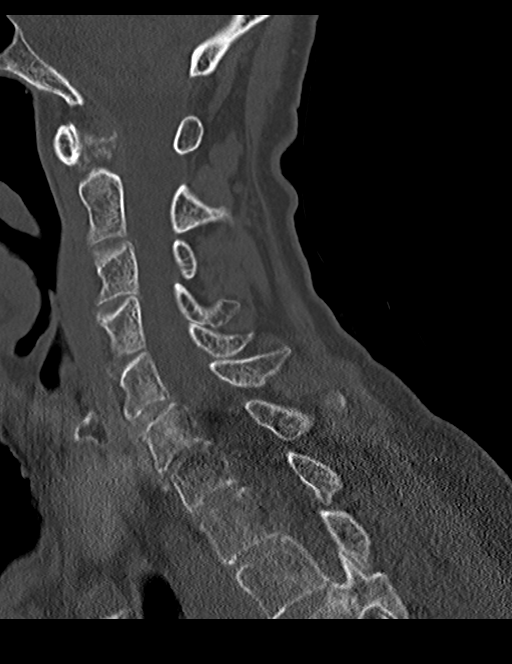
[im 37/64  bone]
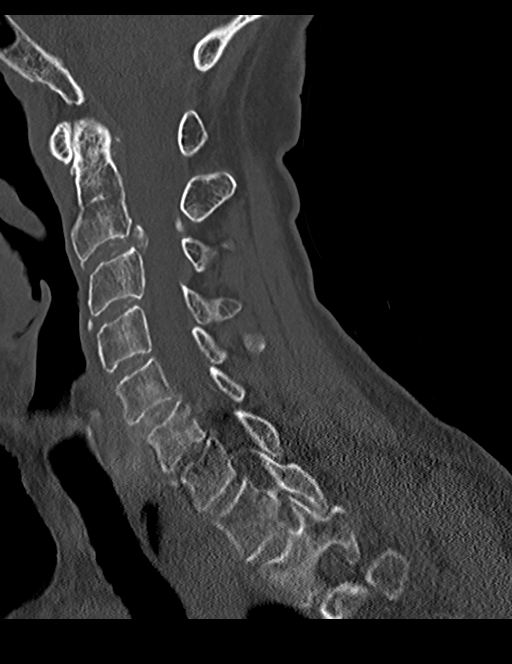
[im 43/64  bone]
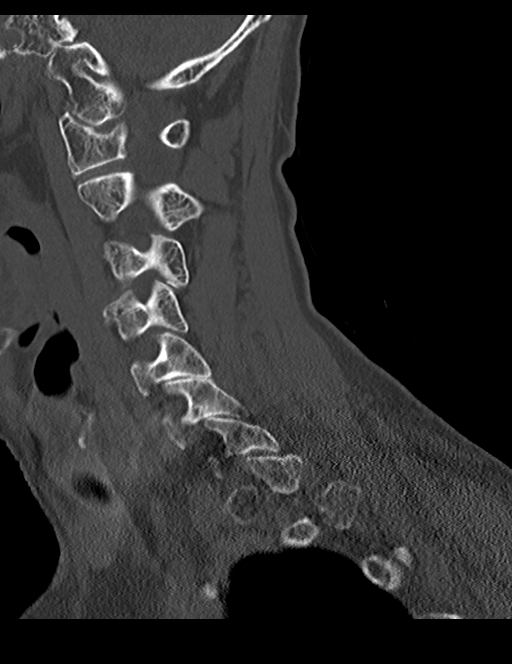

[Series 7: orthogonals · axial · 0.27mm/px · z∈[+723,+877]mm · 4 of 120 slices shown, 5 images]
[im 18/120  soft-tissue]
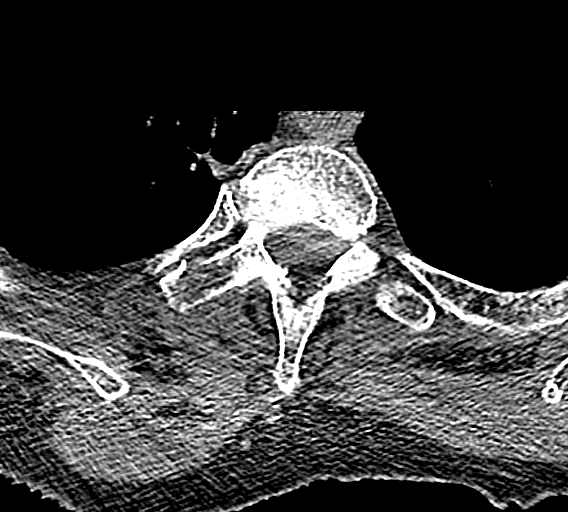
[im 18/120  bone]
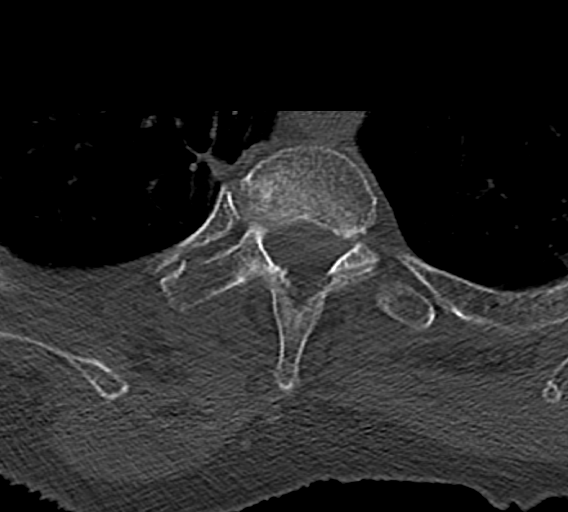
[im 52/120  bone]
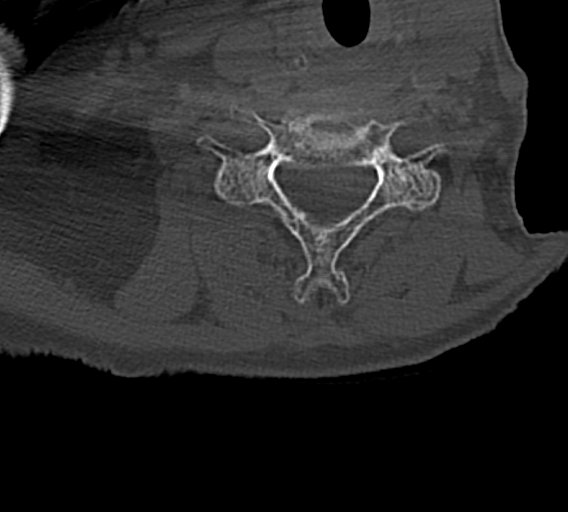
[im 69/120  bone]
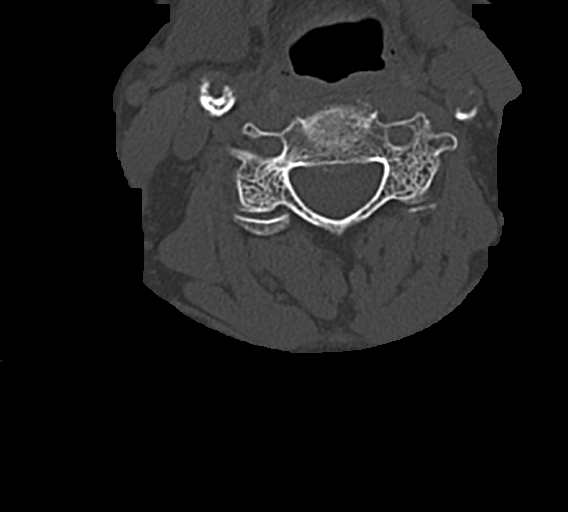
[im 103/120  bone]
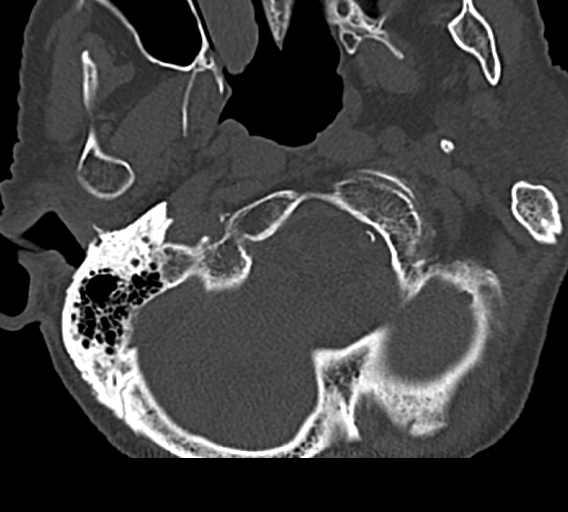

[12 of 33 positions shown; findings below may reference images not displayed]

FINDINGS: CT HEAD FINDINGS

Brain: Mild age-related atrophy and chronic microvascular ischemic
changes. There is no acute intracranial hemorrhage. No mass effect
or midline shift. No extra-axial fluid collection.

Vascular: No hyperdense vessel or unexpected calcification.

Skull: Normal. Negative for fracture or focal lesion.

Sinuses/Orbits: No acute finding.

Other: Left posterior parietal scalp hematoma.

CT CERVICAL SPINE FINDINGS

Alignment: No acute subluxation.

Skull base and vertebrae: No acute fracture. Osteopenia.

Soft tissues and spinal canal: No prevertebral fluid or swelling. No
visible canal hematoma.

Disc levels:  Degenerative changes. No acute findings.

Upper chest: A 15 mm right upper lobe subpleural linear density,
likely scarring. This was present on the CT of 0808.

Other: Bilateral carotid bulb calcified plaques.
IMPRESSION: 1. No acute intracranial hemorrhage. Mild age-related atrophy and
chronic microvascular ischemic changes.
2. No acute/traumatic cervical spine pathology.

## 2022-10-14 ENCOUNTER — Other Ambulatory Visit: Payer: Self-pay | Admitting: Orthopaedic Surgery

## 2022-10-14 DIAGNOSIS — Z01818 Encounter for other preprocedural examination: Secondary | ICD-10-CM

## 2022-11-17 ENCOUNTER — Other Ambulatory Visit: Payer: Medicare Other

## 2022-12-02 ENCOUNTER — Other Ambulatory Visit: Payer: Medicare Other

## 2022-12-24 ENCOUNTER — Other Ambulatory Visit: Payer: Medicare Other
